# Patient Record
Sex: Male | Born: 1980
Health system: Southern US, Community
[De-identification: ages and names within clinical notes are randomized; demographics above are authoritative.]

## PROBLEM LIST (undated history)

## (undated) DIAGNOSIS — K5792 Diverticulitis of intestine, part unspecified, without perforation or abscess without bleeding: Secondary | ICD-10-CM

## (undated) HISTORY — PX: KNEE SURGERY: SHX244

---

## 2015-11-12 DIAGNOSIS — G4733 Obstructive sleep apnea (adult) (pediatric): Secondary | ICD-10-CM | POA: Diagnosis not present

## 2015-11-23 DIAGNOSIS — G4733 Obstructive sleep apnea (adult) (pediatric): Secondary | ICD-10-CM | POA: Diagnosis not present

## 2016-01-07 DIAGNOSIS — M9903 Segmental and somatic dysfunction of lumbar region: Secondary | ICD-10-CM | POA: Diagnosis not present

## 2016-01-07 DIAGNOSIS — M531 Cervicobrachial syndrome: Secondary | ICD-10-CM | POA: Diagnosis not present

## 2016-01-07 DIAGNOSIS — M9901 Segmental and somatic dysfunction of cervical region: Secondary | ICD-10-CM | POA: Diagnosis not present

## 2016-01-07 DIAGNOSIS — M9902 Segmental and somatic dysfunction of thoracic region: Secondary | ICD-10-CM | POA: Diagnosis not present

## 2016-02-04 DIAGNOSIS — M9903 Segmental and somatic dysfunction of lumbar region: Secondary | ICD-10-CM | POA: Diagnosis not present

## 2016-02-04 DIAGNOSIS — M531 Cervicobrachial syndrome: Secondary | ICD-10-CM | POA: Diagnosis not present

## 2016-02-04 DIAGNOSIS — M9901 Segmental and somatic dysfunction of cervical region: Secondary | ICD-10-CM | POA: Diagnosis not present

## 2016-02-04 DIAGNOSIS — M9902 Segmental and somatic dysfunction of thoracic region: Secondary | ICD-10-CM | POA: Diagnosis not present

## 2016-03-01 DIAGNOSIS — M9901 Segmental and somatic dysfunction of cervical region: Secondary | ICD-10-CM | POA: Diagnosis not present

## 2016-03-01 DIAGNOSIS — M9902 Segmental and somatic dysfunction of thoracic region: Secondary | ICD-10-CM | POA: Diagnosis not present

## 2016-03-01 DIAGNOSIS — M531 Cervicobrachial syndrome: Secondary | ICD-10-CM | POA: Diagnosis not present

## 2016-03-01 DIAGNOSIS — M9903 Segmental and somatic dysfunction of lumbar region: Secondary | ICD-10-CM | POA: Diagnosis not present

## 2016-03-29 DIAGNOSIS — M9902 Segmental and somatic dysfunction of thoracic region: Secondary | ICD-10-CM | POA: Diagnosis not present

## 2016-03-29 DIAGNOSIS — M9903 Segmental and somatic dysfunction of lumbar region: Secondary | ICD-10-CM | POA: Diagnosis not present

## 2016-03-29 DIAGNOSIS — M531 Cervicobrachial syndrome: Secondary | ICD-10-CM | POA: Diagnosis not present

## 2016-03-29 DIAGNOSIS — M9901 Segmental and somatic dysfunction of cervical region: Secondary | ICD-10-CM | POA: Diagnosis not present

## 2016-03-31 DIAGNOSIS — G4733 Obstructive sleep apnea (adult) (pediatric): Secondary | ICD-10-CM | POA: Diagnosis not present

## 2016-05-03 DIAGNOSIS — M9901 Segmental and somatic dysfunction of cervical region: Secondary | ICD-10-CM | POA: Diagnosis not present

## 2016-05-03 DIAGNOSIS — M9902 Segmental and somatic dysfunction of thoracic region: Secondary | ICD-10-CM | POA: Diagnosis not present

## 2016-05-03 DIAGNOSIS — M9903 Segmental and somatic dysfunction of lumbar region: Secondary | ICD-10-CM | POA: Diagnosis not present

## 2016-05-03 DIAGNOSIS — M531 Cervicobrachial syndrome: Secondary | ICD-10-CM | POA: Diagnosis not present

## 2016-05-31 DIAGNOSIS — M9902 Segmental and somatic dysfunction of thoracic region: Secondary | ICD-10-CM | POA: Diagnosis not present

## 2016-05-31 DIAGNOSIS — M9903 Segmental and somatic dysfunction of lumbar region: Secondary | ICD-10-CM | POA: Diagnosis not present

## 2016-05-31 DIAGNOSIS — M531 Cervicobrachial syndrome: Secondary | ICD-10-CM | POA: Diagnosis not present

## 2016-05-31 DIAGNOSIS — M9901 Segmental and somatic dysfunction of cervical region: Secondary | ICD-10-CM | POA: Diagnosis not present

## 2016-06-30 DIAGNOSIS — M531 Cervicobrachial syndrome: Secondary | ICD-10-CM | POA: Diagnosis not present

## 2016-06-30 DIAGNOSIS — M9901 Segmental and somatic dysfunction of cervical region: Secondary | ICD-10-CM | POA: Diagnosis not present

## 2016-06-30 DIAGNOSIS — M9903 Segmental and somatic dysfunction of lumbar region: Secondary | ICD-10-CM | POA: Diagnosis not present

## 2016-06-30 DIAGNOSIS — M9902 Segmental and somatic dysfunction of thoracic region: Secondary | ICD-10-CM | POA: Diagnosis not present

## 2016-07-05 DIAGNOSIS — G4733 Obstructive sleep apnea (adult) (pediatric): Secondary | ICD-10-CM | POA: Diagnosis not present

## 2016-07-27 DIAGNOSIS — M9901 Segmental and somatic dysfunction of cervical region: Secondary | ICD-10-CM | POA: Diagnosis not present

## 2016-07-27 DIAGNOSIS — M9902 Segmental and somatic dysfunction of thoracic region: Secondary | ICD-10-CM | POA: Diagnosis not present

## 2016-07-27 DIAGNOSIS — M9903 Segmental and somatic dysfunction of lumbar region: Secondary | ICD-10-CM | POA: Diagnosis not present

## 2016-07-27 DIAGNOSIS — M531 Cervicobrachial syndrome: Secondary | ICD-10-CM | POA: Diagnosis not present

## 2016-08-23 DIAGNOSIS — M9902 Segmental and somatic dysfunction of thoracic region: Secondary | ICD-10-CM | POA: Diagnosis not present

## 2016-08-23 DIAGNOSIS — M531 Cervicobrachial syndrome: Secondary | ICD-10-CM | POA: Diagnosis not present

## 2016-08-23 DIAGNOSIS — M9903 Segmental and somatic dysfunction of lumbar region: Secondary | ICD-10-CM | POA: Diagnosis not present

## 2016-08-23 DIAGNOSIS — M9901 Segmental and somatic dysfunction of cervical region: Secondary | ICD-10-CM | POA: Diagnosis not present

## 2016-09-20 DIAGNOSIS — M531 Cervicobrachial syndrome: Secondary | ICD-10-CM | POA: Diagnosis not present

## 2016-09-20 DIAGNOSIS — M9901 Segmental and somatic dysfunction of cervical region: Secondary | ICD-10-CM | POA: Diagnosis not present

## 2016-09-20 DIAGNOSIS — M9902 Segmental and somatic dysfunction of thoracic region: Secondary | ICD-10-CM | POA: Diagnosis not present

## 2016-09-20 DIAGNOSIS — M9903 Segmental and somatic dysfunction of lumbar region: Secondary | ICD-10-CM | POA: Diagnosis not present

## 2016-09-28 DIAGNOSIS — M25572 Pain in left ankle and joints of left foot: Secondary | ICD-10-CM | POA: Diagnosis not present

## 2016-10-18 DIAGNOSIS — M531 Cervicobrachial syndrome: Secondary | ICD-10-CM | POA: Diagnosis not present

## 2016-10-18 DIAGNOSIS — M9902 Segmental and somatic dysfunction of thoracic region: Secondary | ICD-10-CM | POA: Diagnosis not present

## 2016-10-18 DIAGNOSIS — M9903 Segmental and somatic dysfunction of lumbar region: Secondary | ICD-10-CM | POA: Diagnosis not present

## 2016-10-18 DIAGNOSIS — M9901 Segmental and somatic dysfunction of cervical region: Secondary | ICD-10-CM | POA: Diagnosis not present

## 2016-11-15 DIAGNOSIS — M9903 Segmental and somatic dysfunction of lumbar region: Secondary | ICD-10-CM | POA: Diagnosis not present

## 2016-11-15 DIAGNOSIS — M531 Cervicobrachial syndrome: Secondary | ICD-10-CM | POA: Diagnosis not present

## 2016-11-15 DIAGNOSIS — M9902 Segmental and somatic dysfunction of thoracic region: Secondary | ICD-10-CM | POA: Diagnosis not present

## 2016-11-15 DIAGNOSIS — M9901 Segmental and somatic dysfunction of cervical region: Secondary | ICD-10-CM | POA: Diagnosis not present

## 2017-02-02 DIAGNOSIS — M9901 Segmental and somatic dysfunction of cervical region: Secondary | ICD-10-CM | POA: Diagnosis not present

## 2017-02-02 DIAGNOSIS — M531 Cervicobrachial syndrome: Secondary | ICD-10-CM | POA: Diagnosis not present

## 2017-02-02 DIAGNOSIS — M9903 Segmental and somatic dysfunction of lumbar region: Secondary | ICD-10-CM | POA: Diagnosis not present

## 2017-02-02 DIAGNOSIS — M9902 Segmental and somatic dysfunction of thoracic region: Secondary | ICD-10-CM | POA: Diagnosis not present

## 2017-03-09 DIAGNOSIS — G4733 Obstructive sleep apnea (adult) (pediatric): Secondary | ICD-10-CM | POA: Diagnosis not present

## 2017-03-14 DIAGNOSIS — M9902 Segmental and somatic dysfunction of thoracic region: Secondary | ICD-10-CM | POA: Diagnosis not present

## 2017-03-14 DIAGNOSIS — M531 Cervicobrachial syndrome: Secondary | ICD-10-CM | POA: Diagnosis not present

## 2017-03-14 DIAGNOSIS — M9901 Segmental and somatic dysfunction of cervical region: Secondary | ICD-10-CM | POA: Diagnosis not present

## 2017-03-14 DIAGNOSIS — M9903 Segmental and somatic dysfunction of lumbar region: Secondary | ICD-10-CM | POA: Diagnosis not present

## 2017-04-11 DIAGNOSIS — M9903 Segmental and somatic dysfunction of lumbar region: Secondary | ICD-10-CM | POA: Diagnosis not present

## 2017-04-11 DIAGNOSIS — M9901 Segmental and somatic dysfunction of cervical region: Secondary | ICD-10-CM | POA: Diagnosis not present

## 2017-04-11 DIAGNOSIS — M531 Cervicobrachial syndrome: Secondary | ICD-10-CM | POA: Diagnosis not present

## 2017-04-11 DIAGNOSIS — M9902 Segmental and somatic dysfunction of thoracic region: Secondary | ICD-10-CM | POA: Diagnosis not present

## 2017-05-09 DIAGNOSIS — M9901 Segmental and somatic dysfunction of cervical region: Secondary | ICD-10-CM | POA: Diagnosis not present

## 2017-05-09 DIAGNOSIS — M9903 Segmental and somatic dysfunction of lumbar region: Secondary | ICD-10-CM | POA: Diagnosis not present

## 2017-05-09 DIAGNOSIS — M531 Cervicobrachial syndrome: Secondary | ICD-10-CM | POA: Diagnosis not present

## 2017-05-09 DIAGNOSIS — M9902 Segmental and somatic dysfunction of thoracic region: Secondary | ICD-10-CM | POA: Diagnosis not present

## 2017-05-23 DIAGNOSIS — Z0000 Encounter for general adult medical examination without abnormal findings: Secondary | ICD-10-CM | POA: Diagnosis not present

## 2017-06-06 DIAGNOSIS — M531 Cervicobrachial syndrome: Secondary | ICD-10-CM | POA: Diagnosis not present

## 2017-06-06 DIAGNOSIS — M9903 Segmental and somatic dysfunction of lumbar region: Secondary | ICD-10-CM | POA: Diagnosis not present

## 2017-06-06 DIAGNOSIS — M9902 Segmental and somatic dysfunction of thoracic region: Secondary | ICD-10-CM | POA: Diagnosis not present

## 2017-06-06 DIAGNOSIS — M9901 Segmental and somatic dysfunction of cervical region: Secondary | ICD-10-CM | POA: Diagnosis not present

## 2017-06-11 DIAGNOSIS — L723 Sebaceous cyst: Secondary | ICD-10-CM | POA: Diagnosis not present

## 2017-07-04 DIAGNOSIS — M9903 Segmental and somatic dysfunction of lumbar region: Secondary | ICD-10-CM | POA: Diagnosis not present

## 2017-07-04 DIAGNOSIS — M9902 Segmental and somatic dysfunction of thoracic region: Secondary | ICD-10-CM | POA: Diagnosis not present

## 2017-07-04 DIAGNOSIS — M531 Cervicobrachial syndrome: Secondary | ICD-10-CM | POA: Diagnosis not present

## 2017-07-04 DIAGNOSIS — M9901 Segmental and somatic dysfunction of cervical region: Secondary | ICD-10-CM | POA: Diagnosis not present

## 2017-08-08 DIAGNOSIS — M531 Cervicobrachial syndrome: Secondary | ICD-10-CM | POA: Diagnosis not present

## 2017-08-08 DIAGNOSIS — M9901 Segmental and somatic dysfunction of cervical region: Secondary | ICD-10-CM | POA: Diagnosis not present

## 2017-08-08 DIAGNOSIS — M9903 Segmental and somatic dysfunction of lumbar region: Secondary | ICD-10-CM | POA: Diagnosis not present

## 2017-08-08 DIAGNOSIS — M9902 Segmental and somatic dysfunction of thoracic region: Secondary | ICD-10-CM | POA: Diagnosis not present

## 2017-09-12 DIAGNOSIS — M9901 Segmental and somatic dysfunction of cervical region: Secondary | ICD-10-CM | POA: Diagnosis not present

## 2017-09-12 DIAGNOSIS — M531 Cervicobrachial syndrome: Secondary | ICD-10-CM | POA: Diagnosis not present

## 2017-09-12 DIAGNOSIS — M9902 Segmental and somatic dysfunction of thoracic region: Secondary | ICD-10-CM | POA: Diagnosis not present

## 2017-09-12 DIAGNOSIS — M9903 Segmental and somatic dysfunction of lumbar region: Secondary | ICD-10-CM | POA: Diagnosis not present

## 2017-10-17 DIAGNOSIS — M9903 Segmental and somatic dysfunction of lumbar region: Secondary | ICD-10-CM | POA: Diagnosis not present

## 2017-10-17 DIAGNOSIS — M9902 Segmental and somatic dysfunction of thoracic region: Secondary | ICD-10-CM | POA: Diagnosis not present

## 2017-10-17 DIAGNOSIS — M9901 Segmental and somatic dysfunction of cervical region: Secondary | ICD-10-CM | POA: Diagnosis not present

## 2017-10-17 DIAGNOSIS — M531 Cervicobrachial syndrome: Secondary | ICD-10-CM | POA: Diagnosis not present

## 2017-11-07 ENCOUNTER — Other Ambulatory Visit: Payer: Self-pay

## 2017-11-07 ENCOUNTER — Encounter (HOSPITAL_BASED_OUTPATIENT_CLINIC_OR_DEPARTMENT_OTHER): Payer: Self-pay

## 2017-11-07 ENCOUNTER — Emergency Department (HOSPITAL_BASED_OUTPATIENT_CLINIC_OR_DEPARTMENT_OTHER): Payer: BLUE CROSS/BLUE SHIELD

## 2017-11-07 ENCOUNTER — Emergency Department (HOSPITAL_BASED_OUTPATIENT_CLINIC_OR_DEPARTMENT_OTHER)
Admission: EM | Admit: 2017-11-07 | Discharge: 2017-11-07 | Disposition: A | Discharge status: Home / Self Care | Payer: BLUE CROSS/BLUE SHIELD | Attending: Emergency Medicine | Admitting: Emergency Medicine

## 2017-11-07 DIAGNOSIS — F17228 Nicotine dependence, chewing tobacco, with other nicotine-induced disorders: Secondary | ICD-10-CM | POA: Diagnosis not present

## 2017-11-07 DIAGNOSIS — R109 Unspecified abdominal pain: Secondary | ICD-10-CM | POA: Diagnosis not present

## 2017-11-07 DIAGNOSIS — R1032 Left lower quadrant pain: Secondary | ICD-10-CM | POA: Diagnosis not present

## 2017-11-07 DIAGNOSIS — K5792 Diverticulitis of intestine, part unspecified, without perforation or abscess without bleeding: Secondary | ICD-10-CM

## 2017-11-07 HISTORY — DX: Diverticulitis of intestine, part unspecified, without perforation or abscess without bleeding: K57.92

## 2017-11-07 LAB — CBC WITH DIFFERENTIAL/PLATELET
Basophils Absolute: 0 10*3/uL (ref 0.0–0.1)
Basophils Relative: 0 %
Eosinophils Absolute: 0.1 10*3/uL (ref 0.0–0.7)
Eosinophils Relative: 1 %
HCT: 41.8 % (ref 39.0–52.0)
Hemoglobin: 14.8 g/dL (ref 13.0–17.0)
Lymphocytes Relative: 19 %
Lymphs Abs: 3 10*3/uL (ref 0.7–4.0)
MCH: 32 pg (ref 26.0–34.0)
MCHC: 35.4 g/dL (ref 30.0–36.0)
MCV: 90.3 fL (ref 78.0–100.0)
Monocytes Absolute: 1.2 10*3/uL — ABNORMAL HIGH (ref 0.1–1.0)
Monocytes Relative: 8 %
Neutro Abs: 11 10*3/uL — ABNORMAL HIGH (ref 1.7–7.7)
Neutrophils Relative %: 72 %
Platelets: 328 10*3/uL (ref 150–400)
RBC: 4.63 MIL/uL (ref 4.22–5.81)
RDW: 13.8 % (ref 11.5–15.5)
WBC: 15.4 10*3/uL — ABNORMAL HIGH (ref 4.0–10.5)

## 2017-11-07 LAB — URINALYSIS, ROUTINE W REFLEX MICROSCOPIC
Bilirubin Urine: NEGATIVE
Glucose, UA: NEGATIVE mg/dL
Ketones, ur: NEGATIVE mg/dL
Leukocytes, UA: NEGATIVE
Nitrite: NEGATIVE
Protein, ur: NEGATIVE mg/dL
Specific Gravity, Urine: <1.005 — ABNORMAL LOW (ref 1.005–1.030)
pH: 6.5 (ref 5.0–8.0)

## 2017-11-07 LAB — COMPREHENSIVE METABOLIC PANEL
ALT: 25 U/L (ref 17–63)
AST: 19 U/L (ref 15–41)
Albumin: 4.2 g/dL (ref 3.5–5.0)
Alkaline Phosphatase: 50 U/L (ref 38–126)
Anion gap: 10 (ref 5–15)
BUN: 13 mg/dL (ref 6–20)
CO2: 22 mmol/L (ref 22–32)
Calcium: 8.8 mg/dL — ABNORMAL LOW (ref 8.9–10.3)
Chloride: 101 mmol/L (ref 101–111)
Creatinine, Ser: 0.95 mg/dL (ref 0.61–1.24)
GFR calc Af Amer: >60 mL/min (ref >60)
GFR calc non Af Amer: >60 mL/min (ref >60)
Glucose, Bld: 101 mg/dL — ABNORMAL HIGH (ref 65–99)
Potassium: 4.1 mmol/L (ref 3.5–5.1)
Sodium: 133 mmol/L — ABNORMAL LOW (ref 135–145)
Total Bilirubin: 0.8 mg/dL (ref 0.3–1.2)
Total Protein: 7.8 g/dL (ref 6.5–8.1)

## 2017-11-07 LAB — URINALYSIS, MICROSCOPIC (REFLEX): WBC, UA: NONE SEEN WBC/hpf (ref 0–5)

## 2017-11-07 MED ORDER — CIPROFLOXACIN IN D5W 400 MG/200ML IV SOLN
400.0000 mg | Freq: Once | INTRAVENOUS | Status: AC
  Administered 2017-11-07: 400 mg via INTRAVENOUS
  Filled 2017-11-07: qty 200

## 2017-11-07 MED ORDER — TRAMADOL HCL 50 MG PO TABS: 50.0000 mg | ORAL_TABLET | Freq: Four times a day (QID) | ORAL | 0 refills | Status: AC | PRN

## 2017-11-07 MED ORDER — METRONIDAZOLE 500 MG PO TABS: 500.0000 mg | ORAL_TABLET | Freq: Two times a day (BID) | ORAL | 0 refills | Status: AC

## 2017-11-07 MED ORDER — IOPAMIDOL (ISOVUE-300) INJECTION 61%
100.0000 mL | Freq: Once | INTRAVENOUS | Status: AC | PRN
  Administered 2017-11-07: 100 mL via INTRAVENOUS

## 2017-11-07 MED ORDER — METRONIDAZOLE 500 MG PO TABS
500.0000 mg | ORAL_TABLET | Freq: Once | ORAL | Status: AC
Start: 2017-11-07 — End: 2017-11-07
  Administered 2017-11-07: 500 mg via ORAL
  Filled 2017-11-07: qty 1

## 2017-11-07 MED ORDER — SODIUM CHLORIDE 0.9 % IV BOLUS
1000.0000 mL | Freq: Once | INTRAVENOUS | Status: AC
  Administered 2017-11-07: 1000 mL via INTRAVENOUS

## 2017-11-07 MED ORDER — CIPROFLOXACIN HCL 500 MG PO TABS: 500.0000 mg | ORAL_TABLET | Freq: Two times a day (BID) | ORAL | 0 refills | Status: AC

## 2017-11-07 NOTE — ED Provider Notes (Signed)
[facility] EMERGENCY DEPARTMENT Provider Note   CSN: [id number] Arrival date & time: 11/07/17  1543     History   Chief Complaint Chief Complaint  Patient presents with  . Abdominal Pain    HPI Melquisedec Journey is a 37 y.o. male.  HPI Patient presents to the emergency department with abdominal pain that started Sunday night into Monday morning.  The patient states that it is mainly left-sided.  The patient states he has had no vomiting or diarrhea.  The patient states that he did not take any medications prior to arrival for symptoms.  Patient states that he has had a history of diverticulitis in the past.  He was treated with antibiotics at that point and no other issues afterward.  The patient denies chest pain, shortness of breath, headache,blurred vision, neck pain, fever, cough, weakness, numbness, dizziness, anorexia, edema,  nausea, vomiting, diarrhea, rash, back pain, dysuria, hematemesis, bloody stool, near syncope, or syncope. Past Medical History:  Diagnosis Date  . Diverticulitis     There are no active problems to display for this patient.   Past Surgical History:  Procedure Laterality Date  . KNEE SURGERY          Home Medications    Prior to Admission medications   Not on File    Family History No family history on file.  Social History Social History   Tobacco Use  . Smoking status: Former Games developer  . Smokeless tobacco: Current User  Substance Use Topics  . Alcohol use: Yes    Comment: weekly  . Drug use: Never     Allergies   Patient has no known allergies.   Review of Systems Review of Systems All other systems negative except as documented in the HPI. All pertinent positives and negatives as reviewed in the HPI.  Physical Exam Updated Vital Signs BP 125/76 (BP Location: Right Arm)   Pulse 83   Temp 98.1 F (36.7 C) (Oral)   Resp 18   Ht 6' (1.829 m)   Wt (!) 158.8 kg (350 lb)   SpO2 99%   BMI 47.47 kg/m    Physical Exam  Constitutional: He is oriented to person, place, and time. He appears well-developed and well-nourished. No distress.  HENT:  Head: Normocephalic and atraumatic.  Mouth/Throat: Oropharynx is clear and moist.  Eyes: Pupils are equal, round, and reactive to light.  Neck: Normal range of motion. Neck supple.  Cardiovascular: Normal rate, regular rhythm and normal heart sounds. Exam reveals no gallop and no friction rub.  No murmur heard. Pulmonary/Chest: Effort normal and breath sounds normal. No respiratory distress. He has no wheezes.  Abdominal: Soft. Normal appearance and bowel sounds are normal. He exhibits no distension and no ascites. There is no hepatomegaly. There is tenderness in the left lower quadrant. There is no rigidity and no guarding.  Neurological: He is alert and oriented to person, place, and time. He exhibits normal muscle tone. Coordination normal.  Skin: Skin is warm and dry. Capillary refill takes less than 2 seconds. No rash noted. No erythema.  Psychiatric: He has a normal mood and affect. His behavior is normal.  Nursing note and vitals reviewed.    ED Treatments / Results  Labs (all labs ordered are listed, but only abnormal results are displayed) Labs Reviewed  COMPREHENSIVE METABOLIC PANEL - Abnormal; Notable for the following components:      Result Value   Sodium 133 (*)    Glucose, Bld 101 (*)  Calcium 8.8 (*)    All other components within normal limits  CBC WITH DIFFERENTIAL/PLATELET - Abnormal; Notable for the following components:   WBC 15.4 (*)    Neutro Abs 11.0 (*)    Monocytes Absolute 1.2 (*)    All other components within normal limits  URINALYSIS, ROUTINE W REFLEX MICROSCOPIC - Abnormal; Notable for the following components:   Specific Gravity, Urine <1.005 (*)    Hgb urine dipstick TRACE (*)    All other components within normal limits  URINALYSIS, MICROSCOPIC (REFLEX) - Abnormal; Notable for the following components:    Bacteria, UA RARE (*)    Squamous Epithelial / LPF 0-5 (*)    All other components within normal limits    EKG None  Radiology Ct Abdomen Pelvis W Contrast  Result Date: 11/07/2017 CLINICAL DATA:  Left lower quadrant abdomen pain EXAM: CT ABDOMEN AND PELVIS WITH CONTRAST TECHNIQUE: Multidetector CT imaging of the abdomen and pelvis was performed using the standard protocol following bolus administration of intravenous contrast. CONTRAST:  100mL ISOVUE-300 IOPAMIDOL (ISOVUE-300) INJECTION 61% COMPARISON:  None. FINDINGS: Lower chest: No acute abnormality. Hepatobiliary: Hepatic steatosis. No calcified gallstones or biliary dilatation Pancreas: Unremarkable. No pancreatic ductal dilatation or surrounding inflammatory changes. Spleen: Normal in size without focal abnormality. Adrenals/Urinary Tract: Adrenal glands are within normal limits. No hydronephrosis. Cyst in the midpole of the left kidney. Bladder unremarkable Stomach/Bowel: The stomach is nonenlarged. No dilated small bowel. Negative appendix. Focal wall thickening with moderate surrounding inflammation involving the distal descending and proximal sigmoid colon, consistent with acute diverticulitis. Vascular/Lymphatic: No significant vascular findings are present. No enlarged abdominal or pelvic lymph nodes. Reproductive: Prostate is unremarkable. Other: Negative for free air or free fluid. Musculoskeletal: Degenerative changes at L4-L5 and L5-S1. No acute or suspicious abnormality IMPRESSION: 1. Findings consistent with acute diverticulitis of the distal descending and proximal sigmoid colon. No perforation. No abscess. 2. Hepatic steatosis. Electronically Signed   By: Jasmine PangKim  Fujinaga M.D.   On: 11/07/2017 19:07    Procedures Procedures (including critical care time)  Medications Ordered in ED Medications  ciprofloxacin (CIPRO) IVPB 400 mg (400 mg Intravenous New Bag/Given 11/07/17 1937)  sodium chloride 0.9 % bolus 1,000 mL (0 mLs  Intravenous Stopped 11/07/17 1731)  iopamidol (ISOVUE-300) 61 % injection 100 mL (100 mLs Intravenous Contrast Given 11/07/17 1842)  metroNIDAZOLE (FLAGYL) tablet 500 mg (500 mg Oral Given 11/07/17 1936)     Initial Impression / Assessment and Plan / ED Course  I have reviewed the triage vital signs and the nursing notes.  Pertinent labs & imaging results that were available during my care of the patient were reviewed by me and considered in my medical decision making (see chart for details).  Clinical Course as of Nov 07 2033  Wed Nov 07, 2017  1716 CT Abdomen Pelvis W Contrast [LB]  1716 CT Abdomen Pelvis W Contrast [LB]  1716 CT Abdomen Pelvis W Contrast [LB]  1716 CT Abdomen Pelvis W Contrast [LB]    Clinical Course User Index [LB] Mervin KungBritt, Lauren L, Student-PA    Patient be treated for diverticulitis.  I will have him follow-up with GI for any worsening symptoms or return here for any changes in his condition.  Patient is advised the plan and all questions were answered.  She has been stable here in the emergency department has not had any vomiting or need for pain control. Final Clinical Impressions(s) / ED Diagnoses   Final diagnoses:  None  ED Discharge Orders    None       Charlestine Night, Cordelia Poche 11/07/17 2039    Tegeler, Canary Brim, MD 11/08/17 [phone]

## 2017-11-07 NOTE — ED Triage Notes (Signed)
C/o left side abd pain x 3 days-NAD-steady gait

## 2017-11-07 NOTE — ED Notes (Signed)
Pt drinking contrast, scan at 1830

## 2017-11-07 NOTE — Discharge Instructions (Signed)
Return here as needed for any worsening in your condition.  Follow-up with the doctor provided.  Increase your fluid intake

## 2017-11-14 DIAGNOSIS — M531 Cervicobrachial syndrome: Secondary | ICD-10-CM | POA: Diagnosis not present

## 2017-11-14 DIAGNOSIS — M9903 Segmental and somatic dysfunction of lumbar region: Secondary | ICD-10-CM | POA: Diagnosis not present

## 2017-11-14 DIAGNOSIS — M9902 Segmental and somatic dysfunction of thoracic region: Secondary | ICD-10-CM | POA: Diagnosis not present

## 2017-11-14 DIAGNOSIS — M9901 Segmental and somatic dysfunction of cervical region: Secondary | ICD-10-CM | POA: Diagnosis not present

## 2017-12-05 DIAGNOSIS — Z8719 Personal history of other diseases of the digestive system: Secondary | ICD-10-CM | POA: Diagnosis not present

## 2017-12-05 DIAGNOSIS — B029 Zoster without complications: Secondary | ICD-10-CM | POA: Diagnosis not present

## 2017-12-05 DIAGNOSIS — R21 Rash and other nonspecific skin eruption: Secondary | ICD-10-CM | POA: Diagnosis not present

## 2017-12-18 DIAGNOSIS — Z8719 Personal history of other diseases of the digestive system: Secondary | ICD-10-CM | POA: Diagnosis not present

## 2017-12-19 DIAGNOSIS — M9901 Segmental and somatic dysfunction of cervical region: Secondary | ICD-10-CM | POA: Diagnosis not present

## 2017-12-19 DIAGNOSIS — M9902 Segmental and somatic dysfunction of thoracic region: Secondary | ICD-10-CM | POA: Diagnosis not present

## 2017-12-19 DIAGNOSIS — M9903 Segmental and somatic dysfunction of lumbar region: Secondary | ICD-10-CM | POA: Diagnosis not present

## 2017-12-19 DIAGNOSIS — M531 Cervicobrachial syndrome: Secondary | ICD-10-CM | POA: Diagnosis not present

## 2018-01-04 DIAGNOSIS — K635 Polyp of colon: Secondary | ICD-10-CM | POA: Diagnosis not present

## 2018-01-04 DIAGNOSIS — K5732 Diverticulitis of large intestine without perforation or abscess without bleeding: Secondary | ICD-10-CM | POA: Diagnosis not present

## 2018-01-04 DIAGNOSIS — K5140 Inflammatory polyps of colon without complications: Secondary | ICD-10-CM | POA: Diagnosis not present

## 2018-01-04 DIAGNOSIS — K5730 Diverticulosis of large intestine without perforation or abscess without bleeding: Secondary | ICD-10-CM | POA: Diagnosis not present

## 2018-01-08 DIAGNOSIS — K5140 Inflammatory polyps of colon without complications: Secondary | ICD-10-CM | POA: Diagnosis not present

## 2018-01-08 DIAGNOSIS — K635 Polyp of colon: Secondary | ICD-10-CM | POA: Diagnosis not present

## 2018-01-16 DIAGNOSIS — M9902 Segmental and somatic dysfunction of thoracic region: Secondary | ICD-10-CM | POA: Diagnosis not present

## 2018-01-16 DIAGNOSIS — M9903 Segmental and somatic dysfunction of lumbar region: Secondary | ICD-10-CM | POA: Diagnosis not present

## 2018-01-16 DIAGNOSIS — M9901 Segmental and somatic dysfunction of cervical region: Secondary | ICD-10-CM | POA: Diagnosis not present

## 2018-01-16 DIAGNOSIS — M531 Cervicobrachial syndrome: Secondary | ICD-10-CM | POA: Diagnosis not present

## 2018-02-20 DIAGNOSIS — M9901 Segmental and somatic dysfunction of cervical region: Secondary | ICD-10-CM | POA: Diagnosis not present

## 2018-02-20 DIAGNOSIS — M531 Cervicobrachial syndrome: Secondary | ICD-10-CM | POA: Diagnosis not present

## 2018-02-20 DIAGNOSIS — M9903 Segmental and somatic dysfunction of lumbar region: Secondary | ICD-10-CM | POA: Diagnosis not present

## 2018-02-20 DIAGNOSIS — M9902 Segmental and somatic dysfunction of thoracic region: Secondary | ICD-10-CM | POA: Diagnosis not present

## 2018-03-22 DIAGNOSIS — M9901 Segmental and somatic dysfunction of cervical region: Secondary | ICD-10-CM | POA: Diagnosis not present

## 2018-03-22 DIAGNOSIS — M9903 Segmental and somatic dysfunction of lumbar region: Secondary | ICD-10-CM | POA: Diagnosis not present

## 2018-03-22 DIAGNOSIS — M9902 Segmental and somatic dysfunction of thoracic region: Secondary | ICD-10-CM | POA: Diagnosis not present

## 2018-03-22 DIAGNOSIS — M531 Cervicobrachial syndrome: Secondary | ICD-10-CM | POA: Diagnosis not present

## 2018-04-22 DIAGNOSIS — M9902 Segmental and somatic dysfunction of thoracic region: Secondary | ICD-10-CM | POA: Diagnosis not present

## 2018-04-22 DIAGNOSIS — M9903 Segmental and somatic dysfunction of lumbar region: Secondary | ICD-10-CM | POA: Diagnosis not present

## 2018-04-22 DIAGNOSIS — M9901 Segmental and somatic dysfunction of cervical region: Secondary | ICD-10-CM | POA: Diagnosis not present

## 2018-04-22 DIAGNOSIS — M531 Cervicobrachial syndrome: Secondary | ICD-10-CM | POA: Diagnosis not present

## 2018-05-20 DIAGNOSIS — M9902 Segmental and somatic dysfunction of thoracic region: Secondary | ICD-10-CM | POA: Diagnosis not present

## 2018-05-20 DIAGNOSIS — M9903 Segmental and somatic dysfunction of lumbar region: Secondary | ICD-10-CM | POA: Diagnosis not present

## 2018-05-20 DIAGNOSIS — M531 Cervicobrachial syndrome: Secondary | ICD-10-CM | POA: Diagnosis not present

## 2018-05-20 DIAGNOSIS — M9901 Segmental and somatic dysfunction of cervical region: Secondary | ICD-10-CM | POA: Diagnosis not present

## 2018-06-17 DIAGNOSIS — M9902 Segmental and somatic dysfunction of thoracic region: Secondary | ICD-10-CM | POA: Diagnosis not present

## 2018-06-17 DIAGNOSIS — M9901 Segmental and somatic dysfunction of cervical region: Secondary | ICD-10-CM | POA: Diagnosis not present

## 2018-06-17 DIAGNOSIS — M9903 Segmental and somatic dysfunction of lumbar region: Secondary | ICD-10-CM | POA: Diagnosis not present

## 2018-06-17 DIAGNOSIS — M531 Cervicobrachial syndrome: Secondary | ICD-10-CM | POA: Diagnosis not present

## 2018-07-18 DIAGNOSIS — M9903 Segmental and somatic dysfunction of lumbar region: Secondary | ICD-10-CM | POA: Diagnosis not present

## 2018-07-18 DIAGNOSIS — M9901 Segmental and somatic dysfunction of cervical region: Secondary | ICD-10-CM | POA: Diagnosis not present

## 2018-07-18 DIAGNOSIS — M9902 Segmental and somatic dysfunction of thoracic region: Secondary | ICD-10-CM | POA: Diagnosis not present

## 2018-07-18 DIAGNOSIS — M531 Cervicobrachial syndrome: Secondary | ICD-10-CM | POA: Diagnosis not present

## 2019-01-01 DIAGNOSIS — M9901 Segmental and somatic dysfunction of cervical region: Secondary | ICD-10-CM | POA: Diagnosis not present

## 2019-01-01 DIAGNOSIS — M9903 Segmental and somatic dysfunction of lumbar region: Secondary | ICD-10-CM | POA: Diagnosis not present

## 2019-01-01 DIAGNOSIS — M9902 Segmental and somatic dysfunction of thoracic region: Secondary | ICD-10-CM | POA: Diagnosis not present

## 2019-01-01 DIAGNOSIS — M531 Cervicobrachial syndrome: Secondary | ICD-10-CM | POA: Diagnosis not present

## 2019-01-29 DIAGNOSIS — M9901 Segmental and somatic dysfunction of cervical region: Secondary | ICD-10-CM | POA: Diagnosis not present

## 2019-01-29 DIAGNOSIS — M9902 Segmental and somatic dysfunction of thoracic region: Secondary | ICD-10-CM | POA: Diagnosis not present

## 2019-01-29 DIAGNOSIS — M9903 Segmental and somatic dysfunction of lumbar region: Secondary | ICD-10-CM | POA: Diagnosis not present

## 2019-01-29 DIAGNOSIS — M531 Cervicobrachial syndrome: Secondary | ICD-10-CM | POA: Diagnosis not present

## 2019-02-26 DIAGNOSIS — M531 Cervicobrachial syndrome: Secondary | ICD-10-CM | POA: Diagnosis not present

## 2019-02-26 DIAGNOSIS — M9903 Segmental and somatic dysfunction of lumbar region: Secondary | ICD-10-CM | POA: Diagnosis not present

## 2019-02-26 DIAGNOSIS — M9901 Segmental and somatic dysfunction of cervical region: Secondary | ICD-10-CM | POA: Diagnosis not present

## 2019-02-26 DIAGNOSIS — M9902 Segmental and somatic dysfunction of thoracic region: Secondary | ICD-10-CM | POA: Diagnosis not present

## 2019-03-19 DIAGNOSIS — M9901 Segmental and somatic dysfunction of cervical region: Secondary | ICD-10-CM | POA: Diagnosis not present

## 2019-03-19 DIAGNOSIS — M531 Cervicobrachial syndrome: Secondary | ICD-10-CM | POA: Diagnosis not present

## 2019-03-19 DIAGNOSIS — M9903 Segmental and somatic dysfunction of lumbar region: Secondary | ICD-10-CM | POA: Diagnosis not present

## 2019-03-19 DIAGNOSIS — M9902 Segmental and somatic dysfunction of thoracic region: Secondary | ICD-10-CM | POA: Diagnosis not present

## 2019-04-16 DIAGNOSIS — M9903 Segmental and somatic dysfunction of lumbar region: Secondary | ICD-10-CM | POA: Diagnosis not present

## 2019-04-16 DIAGNOSIS — M9901 Segmental and somatic dysfunction of cervical region: Secondary | ICD-10-CM | POA: Diagnosis not present

## 2019-04-16 DIAGNOSIS — M531 Cervicobrachial syndrome: Secondary | ICD-10-CM | POA: Diagnosis not present

## 2019-04-16 DIAGNOSIS — M9902 Segmental and somatic dysfunction of thoracic region: Secondary | ICD-10-CM | POA: Diagnosis not present

## 2019-04-22 IMAGING — CT CT ABD-PELV W/ CM
2 of 4 series · 17 of 46 positions shown, 19 images · IV contrast (iopamidol)
Comparison: None.

CLINICAL DATA: Left lower quadrant abdomen pain

EXAM:
CT ABDOMEN AND PELVIS WITH CONTRAST
TECHNIQUE: Multidetector CT imaging of the abdomen and pelvis was performed
using the standard protocol following bolus administration of
intravenous contrast.
CONTRAST:  100mL N8DKN3-100 IOPAMIDOL (N8DKN3-100) INJECTION 61%

[Series 2: axial st · axial · 0.98mm/px · z∈[-756,-220]mm · 14 of 117 slices shown, 16 images]
[im 5/117  soft-tissue]
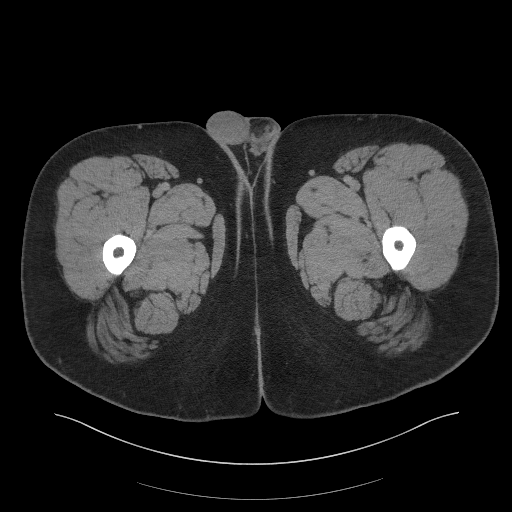
[im 5/117  bone]
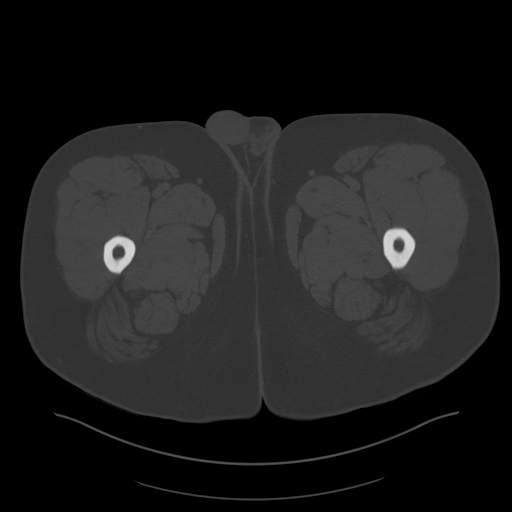
[im 15/117  soft-tissue]
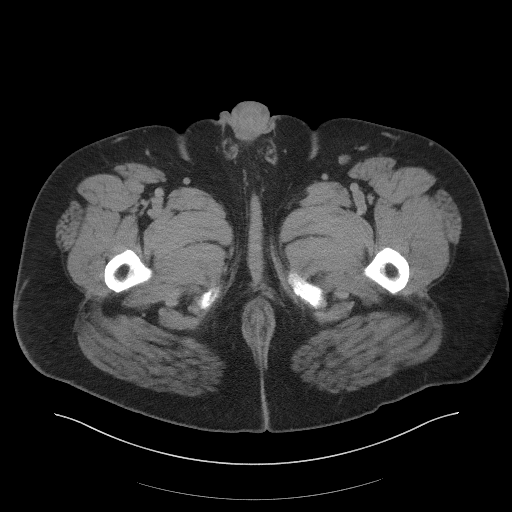
[im 25/117  soft-tissue]
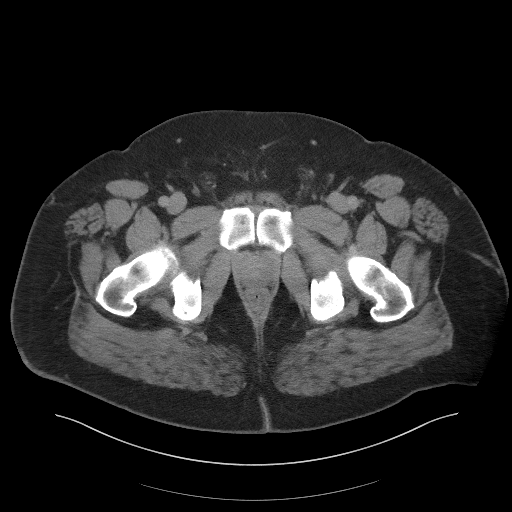
[im 30/117  soft-tissue]
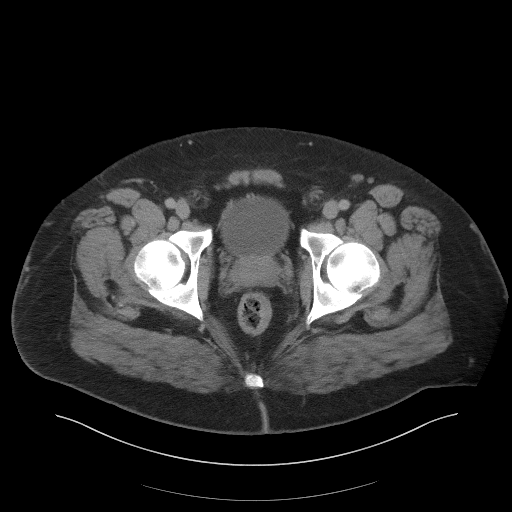
[im 39/117  soft-tissue]
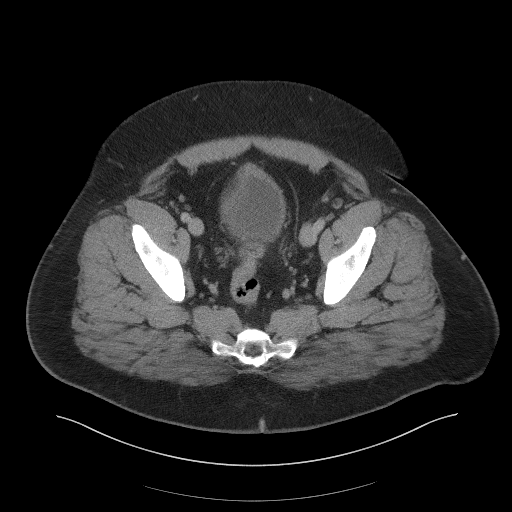
[im 49/117  soft-tissue]
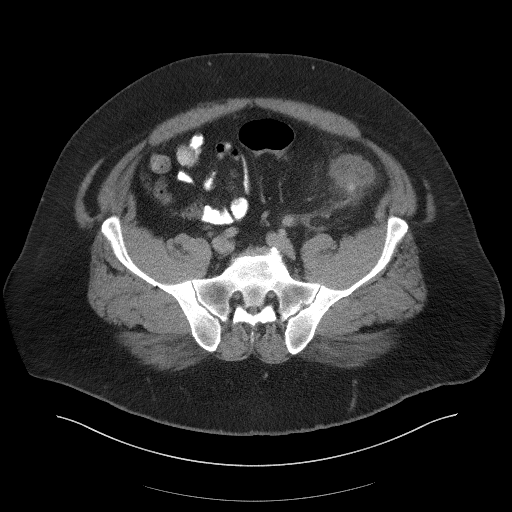
[im 54/117  soft-tissue]
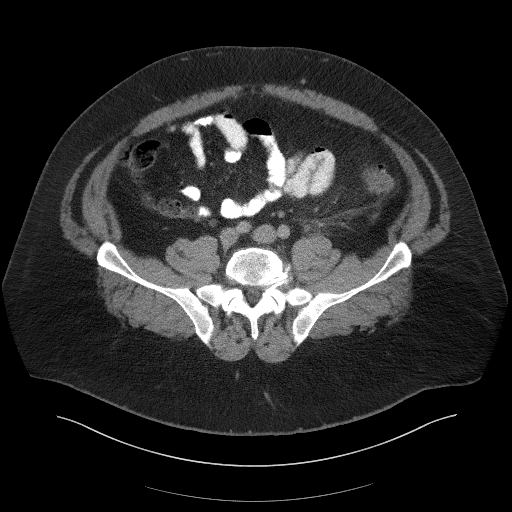
[im 63/117  soft-tissue]
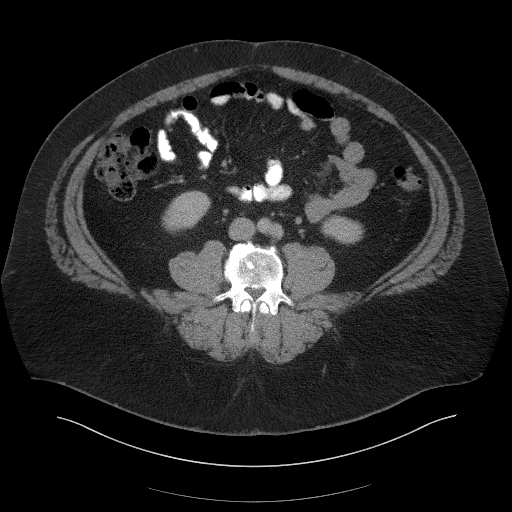
[im 68/117  soft-tissue]
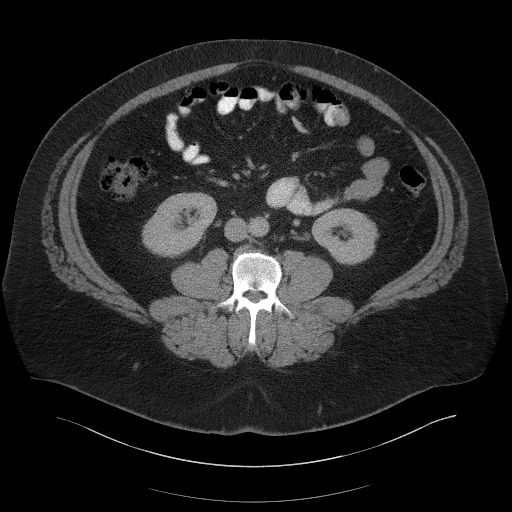
[im 68/117  bone]
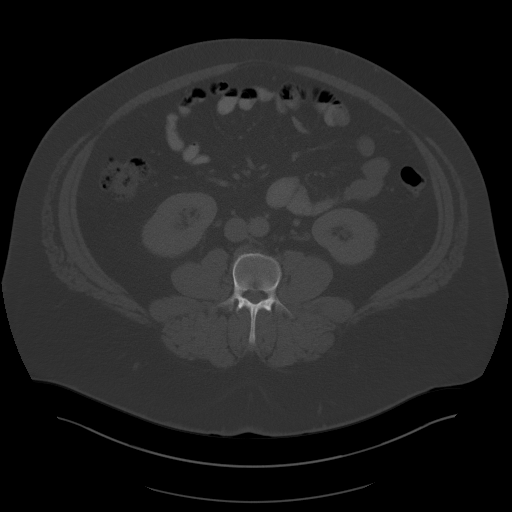
[im 78/117  soft-tissue]
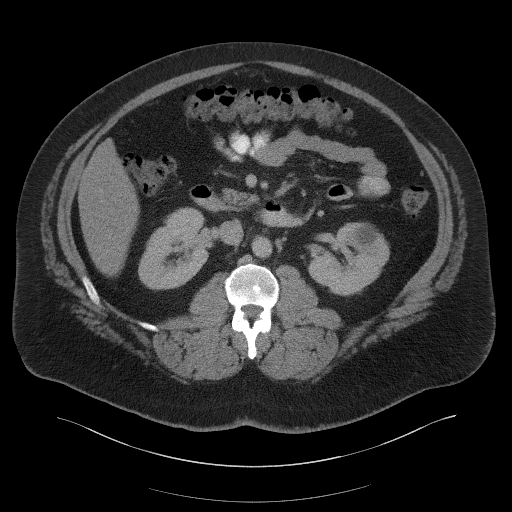
[im 88/117  soft-tissue]
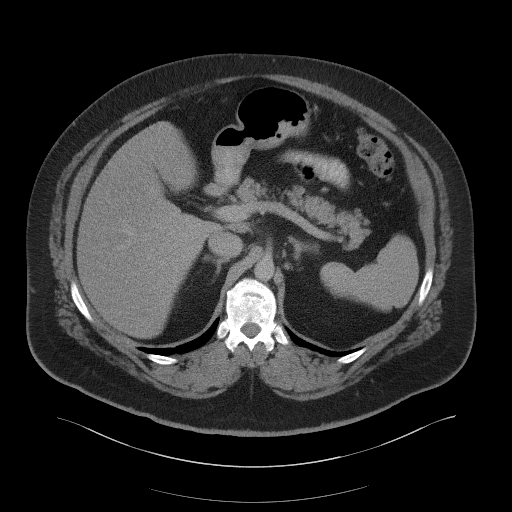
[im 92/117  soft-tissue]
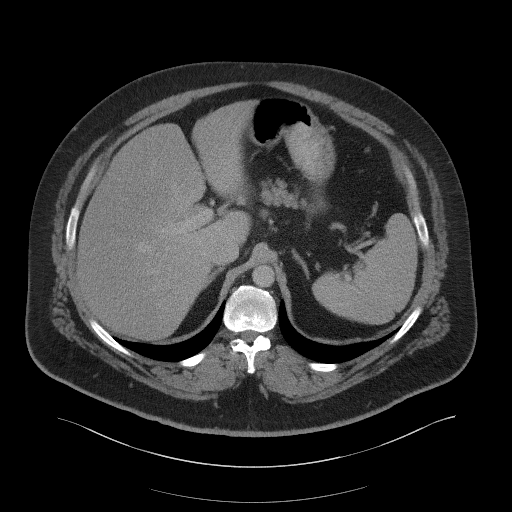
[im 102/117  soft-tissue]
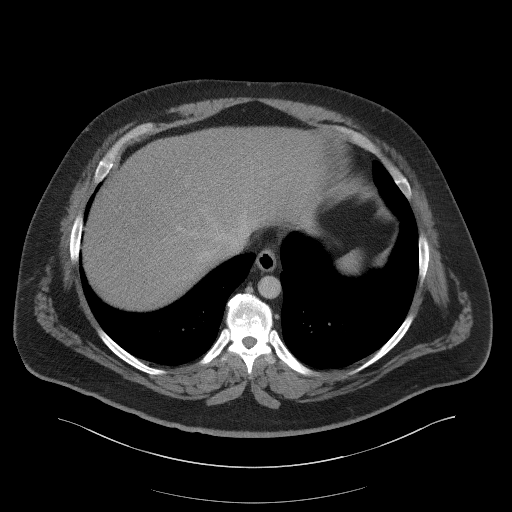
[im 112/117  soft-tissue]
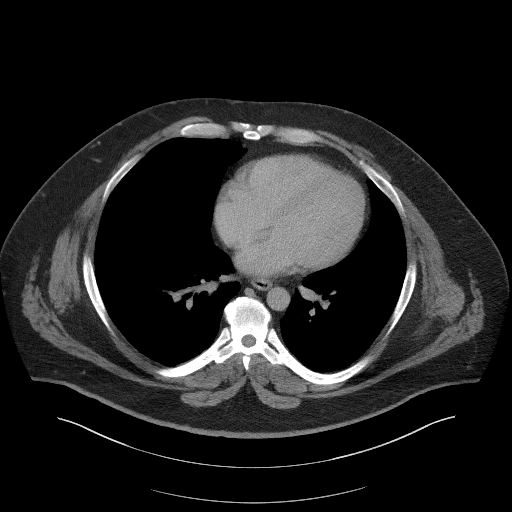

[Series 5: coronal st · coronal · 1.15mm/px · 3 of 121 slices shown]
[im 41/121  soft-tissue]
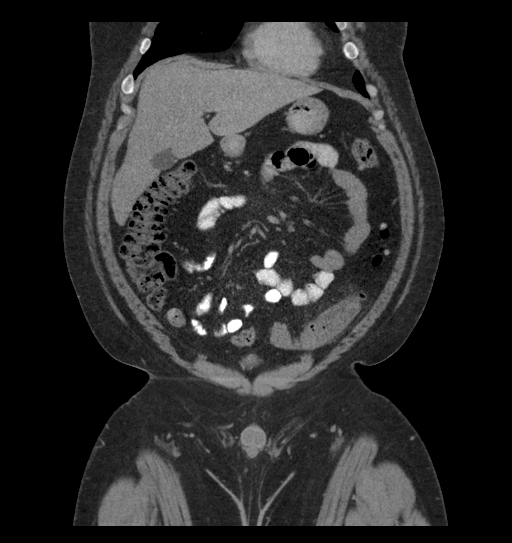
[im 54/121  soft-tissue]
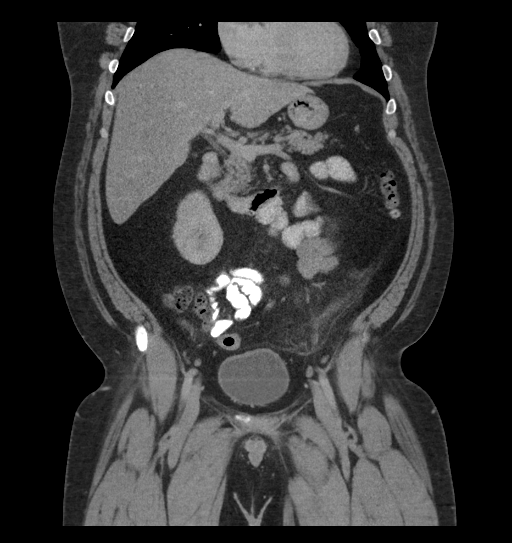
[im 67/121  soft-tissue]
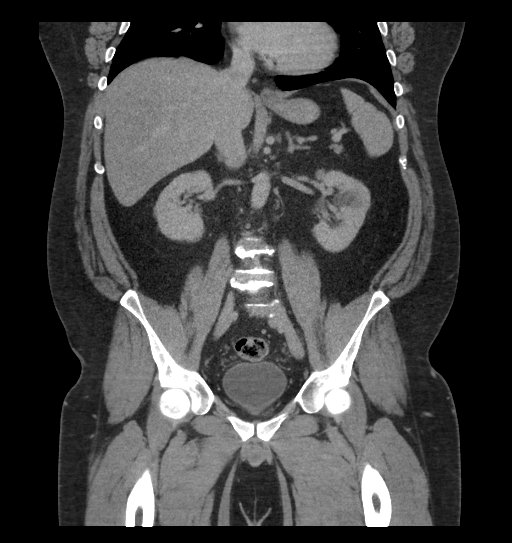

[17 of 46 positions shown; findings below may reference images not displayed]

FINDINGS: Lower chest: No acute abnormality.

Hepatobiliary: Hepatic steatosis. No calcified gallstones or biliary
dilatation

Pancreas: Unremarkable. No pancreatic ductal dilatation or
surrounding inflammatory changes.

Spleen: Normal in size without focal abnormality.

Adrenals/Urinary Tract: Adrenal glands are within normal limits. No
hydronephrosis. Cyst in the midpole of the left kidney. Bladder
unremarkable

Stomach/Bowel: The stomach is nonenlarged. No dilated small bowel.
Negative appendix.

Focal wall thickening with moderate surrounding inflammation
involving the distal descending and proximal sigmoid colon,
consistent with acute diverticulitis.

Vascular/Lymphatic: No significant vascular findings are present. No
enlarged abdominal or pelvic lymph nodes.

Reproductive: Prostate is unremarkable.

Other: Negative for free air or free fluid.

Musculoskeletal: Degenerative changes at L4-L5 and L5-S1. No acute
or suspicious abnormality
IMPRESSION: 1. Findings consistent with acute diverticulitis of the distal
descending and proximal sigmoid colon. No perforation. No abscess.
2. Hepatic steatosis.

## 2019-05-14 DIAGNOSIS — M9903 Segmental and somatic dysfunction of lumbar region: Secondary | ICD-10-CM | POA: Diagnosis not present

## 2019-05-14 DIAGNOSIS — M531 Cervicobrachial syndrome: Secondary | ICD-10-CM | POA: Diagnosis not present

## 2019-05-14 DIAGNOSIS — M9901 Segmental and somatic dysfunction of cervical region: Secondary | ICD-10-CM | POA: Diagnosis not present

## 2019-05-14 DIAGNOSIS — M9902 Segmental and somatic dysfunction of thoracic region: Secondary | ICD-10-CM | POA: Diagnosis not present

## 2019-06-11 DIAGNOSIS — M9901 Segmental and somatic dysfunction of cervical region: Secondary | ICD-10-CM | POA: Diagnosis not present

## 2019-06-11 DIAGNOSIS — M531 Cervicobrachial syndrome: Secondary | ICD-10-CM | POA: Diagnosis not present

## 2019-06-11 DIAGNOSIS — M9903 Segmental and somatic dysfunction of lumbar region: Secondary | ICD-10-CM | POA: Diagnosis not present

## 2019-06-11 DIAGNOSIS — M9902 Segmental and somatic dysfunction of thoracic region: Secondary | ICD-10-CM | POA: Diagnosis not present

## 2019-07-09 DIAGNOSIS — M9903 Segmental and somatic dysfunction of lumbar region: Secondary | ICD-10-CM | POA: Diagnosis not present

## 2019-07-09 DIAGNOSIS — M9901 Segmental and somatic dysfunction of cervical region: Secondary | ICD-10-CM | POA: Diagnosis not present

## 2019-07-09 DIAGNOSIS — M9902 Segmental and somatic dysfunction of thoracic region: Secondary | ICD-10-CM | POA: Diagnosis not present

## 2019-07-09 DIAGNOSIS — M531 Cervicobrachial syndrome: Secondary | ICD-10-CM | POA: Diagnosis not present

## 2019-08-06 DIAGNOSIS — M531 Cervicobrachial syndrome: Secondary | ICD-10-CM | POA: Diagnosis not present

## 2019-08-06 DIAGNOSIS — M9903 Segmental and somatic dysfunction of lumbar region: Secondary | ICD-10-CM | POA: Diagnosis not present

## 2019-08-06 DIAGNOSIS — M9902 Segmental and somatic dysfunction of thoracic region: Secondary | ICD-10-CM | POA: Diagnosis not present

## 2019-08-06 DIAGNOSIS — M9901 Segmental and somatic dysfunction of cervical region: Secondary | ICD-10-CM | POA: Diagnosis not present

## 2019-09-03 DIAGNOSIS — M9902 Segmental and somatic dysfunction of thoracic region: Secondary | ICD-10-CM | POA: Diagnosis not present

## 2019-09-03 DIAGNOSIS — M9903 Segmental and somatic dysfunction of lumbar region: Secondary | ICD-10-CM | POA: Diagnosis not present

## 2019-09-03 DIAGNOSIS — M531 Cervicobrachial syndrome: Secondary | ICD-10-CM | POA: Diagnosis not present

## 2019-09-03 DIAGNOSIS — M9901 Segmental and somatic dysfunction of cervical region: Secondary | ICD-10-CM | POA: Diagnosis not present

## 2019-09-10 DIAGNOSIS — M109 Gout, unspecified: Secondary | ICD-10-CM | POA: Diagnosis not present

## 2019-10-06 DIAGNOSIS — M9901 Segmental and somatic dysfunction of cervical region: Secondary | ICD-10-CM | POA: Diagnosis not present

## 2019-10-06 DIAGNOSIS — M9902 Segmental and somatic dysfunction of thoracic region: Secondary | ICD-10-CM | POA: Diagnosis not present

## 2019-10-06 DIAGNOSIS — M531 Cervicobrachial syndrome: Secondary | ICD-10-CM | POA: Diagnosis not present

## 2019-10-06 DIAGNOSIS — M9903 Segmental and somatic dysfunction of lumbar region: Secondary | ICD-10-CM | POA: Diagnosis not present

## 2019-11-03 DIAGNOSIS — M531 Cervicobrachial syndrome: Secondary | ICD-10-CM | POA: Diagnosis not present

## 2019-11-03 DIAGNOSIS — M9903 Segmental and somatic dysfunction of lumbar region: Secondary | ICD-10-CM | POA: Diagnosis not present

## 2019-11-03 DIAGNOSIS — M9902 Segmental and somatic dysfunction of thoracic region: Secondary | ICD-10-CM | POA: Diagnosis not present

## 2019-11-03 DIAGNOSIS — M9901 Segmental and somatic dysfunction of cervical region: Secondary | ICD-10-CM | POA: Diagnosis not present

## 2019-11-07 DIAGNOSIS — Z23 Encounter for immunization: Secondary | ICD-10-CM | POA: Diagnosis not present

## 2019-12-05 DIAGNOSIS — Z23 Encounter for immunization: Secondary | ICD-10-CM | POA: Diagnosis not present

## 2019-12-08 DIAGNOSIS — M9901 Segmental and somatic dysfunction of cervical region: Secondary | ICD-10-CM | POA: Diagnosis not present

## 2019-12-08 DIAGNOSIS — M9902 Segmental and somatic dysfunction of thoracic region: Secondary | ICD-10-CM | POA: Diagnosis not present

## 2019-12-08 DIAGNOSIS — M531 Cervicobrachial syndrome: Secondary | ICD-10-CM | POA: Diagnosis not present

## 2019-12-08 DIAGNOSIS — M9903 Segmental and somatic dysfunction of lumbar region: Secondary | ICD-10-CM | POA: Diagnosis not present

## 2020-01-05 DIAGNOSIS — M9901 Segmental and somatic dysfunction of cervical region: Secondary | ICD-10-CM | POA: Diagnosis not present

## 2020-01-05 DIAGNOSIS — M9903 Segmental and somatic dysfunction of lumbar region: Secondary | ICD-10-CM | POA: Diagnosis not present

## 2020-01-05 DIAGNOSIS — M531 Cervicobrachial syndrome: Secondary | ICD-10-CM | POA: Diagnosis not present

## 2020-01-05 DIAGNOSIS — M9902 Segmental and somatic dysfunction of thoracic region: Secondary | ICD-10-CM | POA: Diagnosis not present

## 2020-02-09 DIAGNOSIS — M531 Cervicobrachial syndrome: Secondary | ICD-10-CM | POA: Diagnosis not present

## 2020-02-09 DIAGNOSIS — M9903 Segmental and somatic dysfunction of lumbar region: Secondary | ICD-10-CM | POA: Diagnosis not present

## 2020-02-09 DIAGNOSIS — M9902 Segmental and somatic dysfunction of thoracic region: Secondary | ICD-10-CM | POA: Diagnosis not present

## 2020-02-09 DIAGNOSIS — M9901 Segmental and somatic dysfunction of cervical region: Secondary | ICD-10-CM | POA: Diagnosis not present

## 2020-03-08 DIAGNOSIS — M531 Cervicobrachial syndrome: Secondary | ICD-10-CM | POA: Diagnosis not present

## 2020-03-08 DIAGNOSIS — M9902 Segmental and somatic dysfunction of thoracic region: Secondary | ICD-10-CM | POA: Diagnosis not present

## 2020-03-08 DIAGNOSIS — M9901 Segmental and somatic dysfunction of cervical region: Secondary | ICD-10-CM | POA: Diagnosis not present

## 2020-03-08 DIAGNOSIS — M9903 Segmental and somatic dysfunction of lumbar region: Secondary | ICD-10-CM | POA: Diagnosis not present

## 2020-04-14 DIAGNOSIS — M9902 Segmental and somatic dysfunction of thoracic region: Secondary | ICD-10-CM | POA: Diagnosis not present

## 2020-04-14 DIAGNOSIS — M9901 Segmental and somatic dysfunction of cervical region: Secondary | ICD-10-CM | POA: Diagnosis not present

## 2020-04-14 DIAGNOSIS — M9903 Segmental and somatic dysfunction of lumbar region: Secondary | ICD-10-CM | POA: Diagnosis not present

## 2020-04-14 DIAGNOSIS — M531 Cervicobrachial syndrome: Secondary | ICD-10-CM | POA: Diagnosis not present

## 2020-05-12 DIAGNOSIS — M531 Cervicobrachial syndrome: Secondary | ICD-10-CM | POA: Diagnosis not present

## 2020-05-12 DIAGNOSIS — M9903 Segmental and somatic dysfunction of lumbar region: Secondary | ICD-10-CM | POA: Diagnosis not present

## 2020-05-12 DIAGNOSIS — M9902 Segmental and somatic dysfunction of thoracic region: Secondary | ICD-10-CM | POA: Diagnosis not present

## 2020-05-12 DIAGNOSIS — M9901 Segmental and somatic dysfunction of cervical region: Secondary | ICD-10-CM | POA: Diagnosis not present

## 2020-06-09 DIAGNOSIS — M531 Cervicobrachial syndrome: Secondary | ICD-10-CM | POA: Diagnosis not present

## 2020-06-09 DIAGNOSIS — M9903 Segmental and somatic dysfunction of lumbar region: Secondary | ICD-10-CM | POA: Diagnosis not present

## 2020-06-09 DIAGNOSIS — M9902 Segmental and somatic dysfunction of thoracic region: Secondary | ICD-10-CM | POA: Diagnosis not present

## 2020-06-09 DIAGNOSIS — M9901 Segmental and somatic dysfunction of cervical region: Secondary | ICD-10-CM | POA: Diagnosis not present

## 2020-07-07 DIAGNOSIS — M9902 Segmental and somatic dysfunction of thoracic region: Secondary | ICD-10-CM | POA: Diagnosis not present

## 2020-07-07 DIAGNOSIS — M9901 Segmental and somatic dysfunction of cervical region: Secondary | ICD-10-CM | POA: Diagnosis not present

## 2020-07-07 DIAGNOSIS — M9903 Segmental and somatic dysfunction of lumbar region: Secondary | ICD-10-CM | POA: Diagnosis not present

## 2020-07-07 DIAGNOSIS — M531 Cervicobrachial syndrome: Secondary | ICD-10-CM | POA: Diagnosis not present

## 2020-08-04 DIAGNOSIS — M9902 Segmental and somatic dysfunction of thoracic region: Secondary | ICD-10-CM | POA: Diagnosis not present

## 2020-08-04 DIAGNOSIS — M9903 Segmental and somatic dysfunction of lumbar region: Secondary | ICD-10-CM | POA: Diagnosis not present

## 2020-08-04 DIAGNOSIS — M9901 Segmental and somatic dysfunction of cervical region: Secondary | ICD-10-CM | POA: Diagnosis not present

## 2020-08-04 DIAGNOSIS — M531 Cervicobrachial syndrome: Secondary | ICD-10-CM | POA: Diagnosis not present

## 2020-09-08 DIAGNOSIS — M9902 Segmental and somatic dysfunction of thoracic region: Secondary | ICD-10-CM | POA: Diagnosis not present

## 2020-09-08 DIAGNOSIS — M9901 Segmental and somatic dysfunction of cervical region: Secondary | ICD-10-CM | POA: Diagnosis not present

## 2020-09-08 DIAGNOSIS — M9903 Segmental and somatic dysfunction of lumbar region: Secondary | ICD-10-CM | POA: Diagnosis not present

## 2020-09-08 DIAGNOSIS — M531 Cervicobrachial syndrome: Secondary | ICD-10-CM | POA: Diagnosis not present

## 2020-10-08 DIAGNOSIS — M9903 Segmental and somatic dysfunction of lumbar region: Secondary | ICD-10-CM | POA: Diagnosis not present

## 2020-10-08 DIAGNOSIS — M9901 Segmental and somatic dysfunction of cervical region: Secondary | ICD-10-CM | POA: Diagnosis not present

## 2020-10-08 DIAGNOSIS — M531 Cervicobrachial syndrome: Secondary | ICD-10-CM | POA: Diagnosis not present

## 2020-10-08 DIAGNOSIS — M9902 Segmental and somatic dysfunction of thoracic region: Secondary | ICD-10-CM | POA: Diagnosis not present

## 2020-11-03 DIAGNOSIS — M9903 Segmental and somatic dysfunction of lumbar region: Secondary | ICD-10-CM | POA: Diagnosis not present

## 2020-11-03 DIAGNOSIS — M9901 Segmental and somatic dysfunction of cervical region: Secondary | ICD-10-CM | POA: Diagnosis not present

## 2020-11-03 DIAGNOSIS — M9902 Segmental and somatic dysfunction of thoracic region: Secondary | ICD-10-CM | POA: Diagnosis not present

## 2020-11-03 DIAGNOSIS — M531 Cervicobrachial syndrome: Secondary | ICD-10-CM | POA: Diagnosis not present

## 2020-12-01 DIAGNOSIS — M9903 Segmental and somatic dysfunction of lumbar region: Secondary | ICD-10-CM | POA: Diagnosis not present

## 2020-12-01 DIAGNOSIS — M531 Cervicobrachial syndrome: Secondary | ICD-10-CM | POA: Diagnosis not present

## 2020-12-01 DIAGNOSIS — M9901 Segmental and somatic dysfunction of cervical region: Secondary | ICD-10-CM | POA: Diagnosis not present

## 2020-12-01 DIAGNOSIS — M9902 Segmental and somatic dysfunction of thoracic region: Secondary | ICD-10-CM | POA: Diagnosis not present

## 2020-12-29 DIAGNOSIS — M9902 Segmental and somatic dysfunction of thoracic region: Secondary | ICD-10-CM | POA: Diagnosis not present

## 2020-12-29 DIAGNOSIS — M9903 Segmental and somatic dysfunction of lumbar region: Secondary | ICD-10-CM | POA: Diagnosis not present

## 2020-12-29 DIAGNOSIS — M531 Cervicobrachial syndrome: Secondary | ICD-10-CM | POA: Diagnosis not present

## 2020-12-29 DIAGNOSIS — M9901 Segmental and somatic dysfunction of cervical region: Secondary | ICD-10-CM | POA: Diagnosis not present

## 2021-01-26 DIAGNOSIS — M9901 Segmental and somatic dysfunction of cervical region: Secondary | ICD-10-CM | POA: Diagnosis not present

## 2021-01-26 DIAGNOSIS — M531 Cervicobrachial syndrome: Secondary | ICD-10-CM | POA: Diagnosis not present

## 2021-01-26 DIAGNOSIS — M9902 Segmental and somatic dysfunction of thoracic region: Secondary | ICD-10-CM | POA: Diagnosis not present

## 2021-01-26 DIAGNOSIS — M9903 Segmental and somatic dysfunction of lumbar region: Secondary | ICD-10-CM | POA: Diagnosis not present

## 2021-02-18 DIAGNOSIS — J0100 Acute maxillary sinusitis, unspecified: Secondary | ICD-10-CM | POA: Diagnosis not present

## 2021-02-23 DIAGNOSIS — M9902 Segmental and somatic dysfunction of thoracic region: Secondary | ICD-10-CM | POA: Diagnosis not present

## 2021-02-23 DIAGNOSIS — M531 Cervicobrachial syndrome: Secondary | ICD-10-CM | POA: Diagnosis not present

## 2021-02-23 DIAGNOSIS — M9903 Segmental and somatic dysfunction of lumbar region: Secondary | ICD-10-CM | POA: Diagnosis not present

## 2021-02-23 DIAGNOSIS — M9901 Segmental and somatic dysfunction of cervical region: Secondary | ICD-10-CM | POA: Diagnosis not present

## 2021-03-23 DIAGNOSIS — M9903 Segmental and somatic dysfunction of lumbar region: Secondary | ICD-10-CM | POA: Diagnosis not present

## 2021-03-23 DIAGNOSIS — M9902 Segmental and somatic dysfunction of thoracic region: Secondary | ICD-10-CM | POA: Diagnosis not present

## 2021-03-23 DIAGNOSIS — M531 Cervicobrachial syndrome: Secondary | ICD-10-CM | POA: Diagnosis not present

## 2021-03-23 DIAGNOSIS — M9901 Segmental and somatic dysfunction of cervical region: Secondary | ICD-10-CM | POA: Diagnosis not present

## 2021-04-20 DIAGNOSIS — M531 Cervicobrachial syndrome: Secondary | ICD-10-CM | POA: Diagnosis not present

## 2021-04-20 DIAGNOSIS — M9902 Segmental and somatic dysfunction of thoracic region: Secondary | ICD-10-CM | POA: Diagnosis not present

## 2021-04-20 DIAGNOSIS — M9903 Segmental and somatic dysfunction of lumbar region: Secondary | ICD-10-CM | POA: Diagnosis not present

## 2021-04-20 DIAGNOSIS — M9901 Segmental and somatic dysfunction of cervical region: Secondary | ICD-10-CM | POA: Diagnosis not present

## 2021-05-19 DIAGNOSIS — Z3009 Encounter for other general counseling and advice on contraception: Secondary | ICD-10-CM | POA: Diagnosis not present

## 2021-05-25 DIAGNOSIS — M531 Cervicobrachial syndrome: Secondary | ICD-10-CM | POA: Diagnosis not present

## 2021-05-25 DIAGNOSIS — M9901 Segmental and somatic dysfunction of cervical region: Secondary | ICD-10-CM | POA: Diagnosis not present

## 2021-05-25 DIAGNOSIS — M9903 Segmental and somatic dysfunction of lumbar region: Secondary | ICD-10-CM | POA: Diagnosis not present

## 2021-05-25 DIAGNOSIS — M9902 Segmental and somatic dysfunction of thoracic region: Secondary | ICD-10-CM | POA: Diagnosis not present

## 2021-06-28 DIAGNOSIS — M9903 Segmental and somatic dysfunction of lumbar region: Secondary | ICD-10-CM | POA: Diagnosis not present

## 2021-06-28 DIAGNOSIS — M531 Cervicobrachial syndrome: Secondary | ICD-10-CM | POA: Diagnosis not present

## 2021-06-28 DIAGNOSIS — M9902 Segmental and somatic dysfunction of thoracic region: Secondary | ICD-10-CM | POA: Diagnosis not present

## 2021-06-28 DIAGNOSIS — M9901 Segmental and somatic dysfunction of cervical region: Secondary | ICD-10-CM | POA: Diagnosis not present

## 2021-07-08 DIAGNOSIS — M531 Cervicobrachial syndrome: Secondary | ICD-10-CM | POA: Diagnosis not present

## 2021-07-08 DIAGNOSIS — M9902 Segmental and somatic dysfunction of thoracic region: Secondary | ICD-10-CM | POA: Diagnosis not present

## 2021-07-08 DIAGNOSIS — M9903 Segmental and somatic dysfunction of lumbar region: Secondary | ICD-10-CM | POA: Diagnosis not present

## 2021-07-08 DIAGNOSIS — M9901 Segmental and somatic dysfunction of cervical region: Secondary | ICD-10-CM | POA: Diagnosis not present

## 2021-07-15 DIAGNOSIS — Z302 Encounter for sterilization: Secondary | ICD-10-CM | POA: Diagnosis not present

## 2021-07-26 DIAGNOSIS — M531 Cervicobrachial syndrome: Secondary | ICD-10-CM | POA: Diagnosis not present

## 2021-07-26 DIAGNOSIS — M9902 Segmental and somatic dysfunction of thoracic region: Secondary | ICD-10-CM | POA: Diagnosis not present

## 2021-07-26 DIAGNOSIS — M9901 Segmental and somatic dysfunction of cervical region: Secondary | ICD-10-CM | POA: Diagnosis not present

## 2021-07-26 DIAGNOSIS — M9903 Segmental and somatic dysfunction of lumbar region: Secondary | ICD-10-CM | POA: Diagnosis not present

## 2021-08-16 DIAGNOSIS — M531 Cervicobrachial syndrome: Secondary | ICD-10-CM | POA: Diagnosis not present

## 2021-08-16 DIAGNOSIS — M9903 Segmental and somatic dysfunction of lumbar region: Secondary | ICD-10-CM | POA: Diagnosis not present

## 2021-08-16 DIAGNOSIS — M9901 Segmental and somatic dysfunction of cervical region: Secondary | ICD-10-CM | POA: Diagnosis not present

## 2021-08-16 DIAGNOSIS — M9902 Segmental and somatic dysfunction of thoracic region: Secondary | ICD-10-CM | POA: Diagnosis not present

## 2021-09-13 DIAGNOSIS — M9902 Segmental and somatic dysfunction of thoracic region: Secondary | ICD-10-CM | POA: Diagnosis not present

## 2021-09-13 DIAGNOSIS — M9903 Segmental and somatic dysfunction of lumbar region: Secondary | ICD-10-CM | POA: Diagnosis not present

## 2021-09-13 DIAGNOSIS — M9901 Segmental and somatic dysfunction of cervical region: Secondary | ICD-10-CM | POA: Diagnosis not present

## 2021-09-13 DIAGNOSIS — M531 Cervicobrachial syndrome: Secondary | ICD-10-CM | POA: Diagnosis not present

## 2021-10-11 DIAGNOSIS — M9901 Segmental and somatic dysfunction of cervical region: Secondary | ICD-10-CM | POA: Diagnosis not present

## 2021-10-11 DIAGNOSIS — M9903 Segmental and somatic dysfunction of lumbar region: Secondary | ICD-10-CM | POA: Diagnosis not present

## 2021-10-11 DIAGNOSIS — M9902 Segmental and somatic dysfunction of thoracic region: Secondary | ICD-10-CM | POA: Diagnosis not present

## 2021-10-11 DIAGNOSIS — M531 Cervicobrachial syndrome: Secondary | ICD-10-CM | POA: Diagnosis not present

## 2021-11-08 DIAGNOSIS — M9902 Segmental and somatic dysfunction of thoracic region: Secondary | ICD-10-CM | POA: Diagnosis not present

## 2021-11-08 DIAGNOSIS — M9901 Segmental and somatic dysfunction of cervical region: Secondary | ICD-10-CM | POA: Diagnosis not present

## 2021-11-08 DIAGNOSIS — M9903 Segmental and somatic dysfunction of lumbar region: Secondary | ICD-10-CM | POA: Diagnosis not present

## 2021-11-08 DIAGNOSIS — M531 Cervicobrachial syndrome: Secondary | ICD-10-CM | POA: Diagnosis not present

## 2021-11-30 DIAGNOSIS — M10071 Idiopathic gout, right ankle and foot: Secondary | ICD-10-CM | POA: Diagnosis not present

## 2021-12-06 DIAGNOSIS — M531 Cervicobrachial syndrome: Secondary | ICD-10-CM | POA: Diagnosis not present

## 2021-12-06 DIAGNOSIS — M9901 Segmental and somatic dysfunction of cervical region: Secondary | ICD-10-CM | POA: Diagnosis not present

## 2021-12-06 DIAGNOSIS — M9902 Segmental and somatic dysfunction of thoracic region: Secondary | ICD-10-CM | POA: Diagnosis not present

## 2021-12-06 DIAGNOSIS — M9903 Segmental and somatic dysfunction of lumbar region: Secondary | ICD-10-CM | POA: Diagnosis not present

## 2021-12-12 DIAGNOSIS — M109 Gout, unspecified: Secondary | ICD-10-CM | POA: Diagnosis not present

## 2022-01-10 DIAGNOSIS — M9901 Segmental and somatic dysfunction of cervical region: Secondary | ICD-10-CM | POA: Diagnosis not present

## 2022-01-10 DIAGNOSIS — M9902 Segmental and somatic dysfunction of thoracic region: Secondary | ICD-10-CM | POA: Diagnosis not present

## 2022-01-10 DIAGNOSIS — M531 Cervicobrachial syndrome: Secondary | ICD-10-CM | POA: Diagnosis not present

## 2022-01-10 DIAGNOSIS — M9903 Segmental and somatic dysfunction of lumbar region: Secondary | ICD-10-CM | POA: Diagnosis not present

## 2022-02-07 DIAGNOSIS — M9903 Segmental and somatic dysfunction of lumbar region: Secondary | ICD-10-CM | POA: Diagnosis not present

## 2022-02-07 DIAGNOSIS — M9901 Segmental and somatic dysfunction of cervical region: Secondary | ICD-10-CM | POA: Diagnosis not present

## 2022-02-07 DIAGNOSIS — M9902 Segmental and somatic dysfunction of thoracic region: Secondary | ICD-10-CM | POA: Diagnosis not present

## 2022-02-07 DIAGNOSIS — M531 Cervicobrachial syndrome: Secondary | ICD-10-CM | POA: Diagnosis not present

## 2022-03-15 DIAGNOSIS — M531 Cervicobrachial syndrome: Secondary | ICD-10-CM | POA: Diagnosis not present

## 2022-03-15 DIAGNOSIS — M9902 Segmental and somatic dysfunction of thoracic region: Secondary | ICD-10-CM | POA: Diagnosis not present

## 2022-03-15 DIAGNOSIS — M9903 Segmental and somatic dysfunction of lumbar region: Secondary | ICD-10-CM | POA: Diagnosis not present

## 2022-03-15 DIAGNOSIS — M9901 Segmental and somatic dysfunction of cervical region: Secondary | ICD-10-CM | POA: Diagnosis not present

## 2022-04-12 DIAGNOSIS — M9902 Segmental and somatic dysfunction of thoracic region: Secondary | ICD-10-CM | POA: Diagnosis not present

## 2022-04-12 DIAGNOSIS — M9903 Segmental and somatic dysfunction of lumbar region: Secondary | ICD-10-CM | POA: Diagnosis not present

## 2022-04-12 DIAGNOSIS — M9901 Segmental and somatic dysfunction of cervical region: Secondary | ICD-10-CM | POA: Diagnosis not present

## 2022-04-12 DIAGNOSIS — M531 Cervicobrachial syndrome: Secondary | ICD-10-CM | POA: Diagnosis not present

## 2022-05-10 DIAGNOSIS — M9901 Segmental and somatic dysfunction of cervical region: Secondary | ICD-10-CM | POA: Diagnosis not present

## 2022-05-10 DIAGNOSIS — M9902 Segmental and somatic dysfunction of thoracic region: Secondary | ICD-10-CM | POA: Diagnosis not present

## 2022-05-10 DIAGNOSIS — M531 Cervicobrachial syndrome: Secondary | ICD-10-CM | POA: Diagnosis not present

## 2022-05-10 DIAGNOSIS — M9903 Segmental and somatic dysfunction of lumbar region: Secondary | ICD-10-CM | POA: Diagnosis not present

## 2022-06-07 DIAGNOSIS — M531 Cervicobrachial syndrome: Secondary | ICD-10-CM | POA: Diagnosis not present

## 2022-06-07 DIAGNOSIS — M9903 Segmental and somatic dysfunction of lumbar region: Secondary | ICD-10-CM | POA: Diagnosis not present

## 2022-06-07 DIAGNOSIS — M9901 Segmental and somatic dysfunction of cervical region: Secondary | ICD-10-CM | POA: Diagnosis not present

## 2022-06-07 DIAGNOSIS — M9902 Segmental and somatic dysfunction of thoracic region: Secondary | ICD-10-CM | POA: Diagnosis not present

## 2022-07-05 DIAGNOSIS — M531 Cervicobrachial syndrome: Secondary | ICD-10-CM | POA: Diagnosis not present

## 2022-07-05 DIAGNOSIS — M9903 Segmental and somatic dysfunction of lumbar region: Secondary | ICD-10-CM | POA: Diagnosis not present

## 2022-07-05 DIAGNOSIS — M9901 Segmental and somatic dysfunction of cervical region: Secondary | ICD-10-CM | POA: Diagnosis not present

## 2022-07-05 DIAGNOSIS — M9902 Segmental and somatic dysfunction of thoracic region: Secondary | ICD-10-CM | POA: Diagnosis not present

## 2022-08-02 DIAGNOSIS — M531 Cervicobrachial syndrome: Secondary | ICD-10-CM | POA: Diagnosis not present

## 2022-08-02 DIAGNOSIS — M9901 Segmental and somatic dysfunction of cervical region: Secondary | ICD-10-CM | POA: Diagnosis not present

## 2022-08-02 DIAGNOSIS — M9903 Segmental and somatic dysfunction of lumbar region: Secondary | ICD-10-CM | POA: Diagnosis not present

## 2022-08-02 DIAGNOSIS — M9902 Segmental and somatic dysfunction of thoracic region: Secondary | ICD-10-CM | POA: Diagnosis not present

## 2022-08-30 DIAGNOSIS — M9902 Segmental and somatic dysfunction of thoracic region: Secondary | ICD-10-CM | POA: Diagnosis not present

## 2022-08-30 DIAGNOSIS — M9903 Segmental and somatic dysfunction of lumbar region: Secondary | ICD-10-CM | POA: Diagnosis not present

## 2022-08-30 DIAGNOSIS — M531 Cervicobrachial syndrome: Secondary | ICD-10-CM | POA: Diagnosis not present

## 2022-08-30 DIAGNOSIS — M9901 Segmental and somatic dysfunction of cervical region: Secondary | ICD-10-CM | POA: Diagnosis not present

## 2022-09-29 DIAGNOSIS — M9901 Segmental and somatic dysfunction of cervical region: Secondary | ICD-10-CM | POA: Diagnosis not present

## 2022-09-29 DIAGNOSIS — M9903 Segmental and somatic dysfunction of lumbar region: Secondary | ICD-10-CM | POA: Diagnosis not present

## 2022-09-29 DIAGNOSIS — M531 Cervicobrachial syndrome: Secondary | ICD-10-CM | POA: Diagnosis not present

## 2022-09-29 DIAGNOSIS — M9902 Segmental and somatic dysfunction of thoracic region: Secondary | ICD-10-CM | POA: Diagnosis not present

## 2022-10-27 DIAGNOSIS — M9902 Segmental and somatic dysfunction of thoracic region: Secondary | ICD-10-CM | POA: Diagnosis not present

## 2022-10-27 DIAGNOSIS — M9903 Segmental and somatic dysfunction of lumbar region: Secondary | ICD-10-CM | POA: Diagnosis not present

## 2022-10-27 DIAGNOSIS — M531 Cervicobrachial syndrome: Secondary | ICD-10-CM | POA: Diagnosis not present

## 2022-10-27 DIAGNOSIS — M9901 Segmental and somatic dysfunction of cervical region: Secondary | ICD-10-CM | POA: Diagnosis not present

## 2022-11-24 DIAGNOSIS — M9902 Segmental and somatic dysfunction of thoracic region: Secondary | ICD-10-CM | POA: Diagnosis not present

## 2022-11-24 DIAGNOSIS — M531 Cervicobrachial syndrome: Secondary | ICD-10-CM | POA: Diagnosis not present

## 2022-11-24 DIAGNOSIS — M9901 Segmental and somatic dysfunction of cervical region: Secondary | ICD-10-CM | POA: Diagnosis not present

## 2022-11-24 DIAGNOSIS — M9903 Segmental and somatic dysfunction of lumbar region: Secondary | ICD-10-CM | POA: Diagnosis not present

## 2022-12-22 DIAGNOSIS — M531 Cervicobrachial syndrome: Secondary | ICD-10-CM | POA: Diagnosis not present

## 2022-12-22 DIAGNOSIS — M9902 Segmental and somatic dysfunction of thoracic region: Secondary | ICD-10-CM | POA: Diagnosis not present

## 2022-12-22 DIAGNOSIS — M9903 Segmental and somatic dysfunction of lumbar region: Secondary | ICD-10-CM | POA: Diagnosis not present

## 2022-12-22 DIAGNOSIS — M9901 Segmental and somatic dysfunction of cervical region: Secondary | ICD-10-CM | POA: Diagnosis not present

## 2023-01-19 DIAGNOSIS — M9902 Segmental and somatic dysfunction of thoracic region: Secondary | ICD-10-CM | POA: Diagnosis not present

## 2023-01-19 DIAGNOSIS — M9901 Segmental and somatic dysfunction of cervical region: Secondary | ICD-10-CM | POA: Diagnosis not present

## 2023-01-19 DIAGNOSIS — M9903 Segmental and somatic dysfunction of lumbar region: Secondary | ICD-10-CM | POA: Diagnosis not present

## 2023-01-19 DIAGNOSIS — M531 Cervicobrachial syndrome: Secondary | ICD-10-CM | POA: Diagnosis not present

## 2023-02-16 DIAGNOSIS — M531 Cervicobrachial syndrome: Secondary | ICD-10-CM | POA: Diagnosis not present

## 2023-02-16 DIAGNOSIS — M9901 Segmental and somatic dysfunction of cervical region: Secondary | ICD-10-CM | POA: Diagnosis not present

## 2023-02-16 DIAGNOSIS — M9903 Segmental and somatic dysfunction of lumbar region: Secondary | ICD-10-CM | POA: Diagnosis not present

## 2023-02-16 DIAGNOSIS — M9902 Segmental and somatic dysfunction of thoracic region: Secondary | ICD-10-CM | POA: Diagnosis not present

## 2023-02-21 DIAGNOSIS — R030 Elevated blood-pressure reading, without diagnosis of hypertension: Secondary | ICD-10-CM | POA: Diagnosis not present

## 2023-02-21 DIAGNOSIS — K439 Ventral hernia without obstruction or gangrene: Secondary | ICD-10-CM | POA: Diagnosis not present

## 2023-03-13 DIAGNOSIS — M531 Cervicobrachial syndrome: Secondary | ICD-10-CM | POA: Diagnosis not present

## 2023-03-13 DIAGNOSIS — M9903 Segmental and somatic dysfunction of lumbar region: Secondary | ICD-10-CM | POA: Diagnosis not present

## 2023-03-13 DIAGNOSIS — M9902 Segmental and somatic dysfunction of thoracic region: Secondary | ICD-10-CM | POA: Diagnosis not present

## 2023-03-13 DIAGNOSIS — M9901 Segmental and somatic dysfunction of cervical region: Secondary | ICD-10-CM | POA: Diagnosis not present

## 2023-03-22 DIAGNOSIS — I10 Essential (primary) hypertension: Secondary | ICD-10-CM | POA: Diagnosis not present

## 2023-04-10 DIAGNOSIS — M9901 Segmental and somatic dysfunction of cervical region: Secondary | ICD-10-CM | POA: Diagnosis not present

## 2023-04-10 DIAGNOSIS — M9902 Segmental and somatic dysfunction of thoracic region: Secondary | ICD-10-CM | POA: Diagnosis not present

## 2023-04-10 DIAGNOSIS — M531 Cervicobrachial syndrome: Secondary | ICD-10-CM | POA: Diagnosis not present

## 2023-04-10 DIAGNOSIS — M9903 Segmental and somatic dysfunction of lumbar region: Secondary | ICD-10-CM | POA: Diagnosis not present

## 2023-05-11 DIAGNOSIS — M9901 Segmental and somatic dysfunction of cervical region: Secondary | ICD-10-CM | POA: Diagnosis not present

## 2023-05-11 DIAGNOSIS — M9902 Segmental and somatic dysfunction of thoracic region: Secondary | ICD-10-CM | POA: Diagnosis not present

## 2023-05-11 DIAGNOSIS — M9903 Segmental and somatic dysfunction of lumbar region: Secondary | ICD-10-CM | POA: Diagnosis not present

## 2023-05-11 DIAGNOSIS — M531 Cervicobrachial syndrome: Secondary | ICD-10-CM | POA: Diagnosis not present

## 2023-05-28 DIAGNOSIS — J069 Acute upper respiratory infection, unspecified: Secondary | ICD-10-CM | POA: Diagnosis not present

## 2023-06-07 DIAGNOSIS — I10 Essential (primary) hypertension: Secondary | ICD-10-CM | POA: Diagnosis not present

## 2023-06-07 DIAGNOSIS — E785 Hyperlipidemia, unspecified: Secondary | ICD-10-CM | POA: Diagnosis not present

## 2023-06-13 DIAGNOSIS — M9901 Segmental and somatic dysfunction of cervical region: Secondary | ICD-10-CM | POA: Diagnosis not present

## 2023-06-13 DIAGNOSIS — M9902 Segmental and somatic dysfunction of thoracic region: Secondary | ICD-10-CM | POA: Diagnosis not present

## 2023-06-13 DIAGNOSIS — M531 Cervicobrachial syndrome: Secondary | ICD-10-CM | POA: Diagnosis not present

## 2023-06-13 DIAGNOSIS — M9903 Segmental and somatic dysfunction of lumbar region: Secondary | ICD-10-CM | POA: Diagnosis not present

## 2023-07-13 DIAGNOSIS — M531 Cervicobrachial syndrome: Secondary | ICD-10-CM | POA: Diagnosis not present

## 2023-07-13 DIAGNOSIS — M9901 Segmental and somatic dysfunction of cervical region: Secondary | ICD-10-CM | POA: Diagnosis not present

## 2023-07-13 DIAGNOSIS — M9902 Segmental and somatic dysfunction of thoracic region: Secondary | ICD-10-CM | POA: Diagnosis not present

## 2023-07-13 DIAGNOSIS — M9903 Segmental and somatic dysfunction of lumbar region: Secondary | ICD-10-CM | POA: Diagnosis not present

## 2023-08-15 DIAGNOSIS — M531 Cervicobrachial syndrome: Secondary | ICD-10-CM | POA: Diagnosis not present

## 2023-08-15 DIAGNOSIS — M9902 Segmental and somatic dysfunction of thoracic region: Secondary | ICD-10-CM | POA: Diagnosis not present

## 2023-08-15 DIAGNOSIS — M9903 Segmental and somatic dysfunction of lumbar region: Secondary | ICD-10-CM | POA: Diagnosis not present

## 2023-08-15 DIAGNOSIS — M9901 Segmental and somatic dysfunction of cervical region: Secondary | ICD-10-CM | POA: Diagnosis not present

## 2023-09-26 DIAGNOSIS — M9903 Segmental and somatic dysfunction of lumbar region: Secondary | ICD-10-CM | POA: Diagnosis not present

## 2023-09-26 DIAGNOSIS — M9902 Segmental and somatic dysfunction of thoracic region: Secondary | ICD-10-CM | POA: Diagnosis not present

## 2023-09-26 DIAGNOSIS — M9901 Segmental and somatic dysfunction of cervical region: Secondary | ICD-10-CM | POA: Diagnosis not present

## 2023-09-26 DIAGNOSIS — M531 Cervicobrachial syndrome: Secondary | ICD-10-CM | POA: Diagnosis not present

## 2023-10-24 DIAGNOSIS — M9901 Segmental and somatic dysfunction of cervical region: Secondary | ICD-10-CM | POA: Diagnosis not present

## 2023-10-24 DIAGNOSIS — M9902 Segmental and somatic dysfunction of thoracic region: Secondary | ICD-10-CM | POA: Diagnosis not present

## 2023-10-24 DIAGNOSIS — M9903 Segmental and somatic dysfunction of lumbar region: Secondary | ICD-10-CM | POA: Diagnosis not present

## 2023-10-24 DIAGNOSIS — M531 Cervicobrachial syndrome: Secondary | ICD-10-CM | POA: Diagnosis not present

## 2023-12-03 DIAGNOSIS — M9901 Segmental and somatic dysfunction of cervical region: Secondary | ICD-10-CM | POA: Diagnosis not present

## 2023-12-03 DIAGNOSIS — M9902 Segmental and somatic dysfunction of thoracic region: Secondary | ICD-10-CM | POA: Diagnosis not present

## 2023-12-03 DIAGNOSIS — M9903 Segmental and somatic dysfunction of lumbar region: Secondary | ICD-10-CM | POA: Diagnosis not present

## 2023-12-03 DIAGNOSIS — M531 Cervicobrachial syndrome: Secondary | ICD-10-CM | POA: Diagnosis not present

## 2023-12-17 DIAGNOSIS — E785 Hyperlipidemia, unspecified: Secondary | ICD-10-CM | POA: Diagnosis not present

## 2023-12-17 DIAGNOSIS — I10 Essential (primary) hypertension: Secondary | ICD-10-CM | POA: Diagnosis not present

## 2023-12-17 DIAGNOSIS — R7303 Prediabetes: Secondary | ICD-10-CM | POA: Diagnosis not present

## 2024-01-11 DIAGNOSIS — M531 Cervicobrachial syndrome: Secondary | ICD-10-CM | POA: Diagnosis not present

## 2024-01-11 DIAGNOSIS — M9901 Segmental and somatic dysfunction of cervical region: Secondary | ICD-10-CM | POA: Diagnosis not present

## 2024-01-11 DIAGNOSIS — M9902 Segmental and somatic dysfunction of thoracic region: Secondary | ICD-10-CM | POA: Diagnosis not present

## 2024-01-11 DIAGNOSIS — M9903 Segmental and somatic dysfunction of lumbar region: Secondary | ICD-10-CM | POA: Diagnosis not present
